# Patient Record
Sex: Female | Born: 1940 | Race: White | Hispanic: No | Marital: Married
Health system: Southern US, Community
[De-identification: ages and names within clinical notes are randomized; demographics above are authoritative.]

## PROBLEM LIST (undated history)

## (undated) DIAGNOSIS — J9621 Acute and chronic respiratory failure with hypoxia: Secondary | ICD-10-CM

## (undated) DIAGNOSIS — J181 Lobar pneumonia, unspecified organism: Secondary | ICD-10-CM

## (undated) DIAGNOSIS — N183 Chronic kidney disease, stage 3 unspecified: Secondary | ICD-10-CM

---

## 2020-08-01 ENCOUNTER — Inpatient Hospital Stay
Admission: RE | Admit: 2020-08-01 | Discharge: 2020-08-27 | Disposition: E | Payer: Medicare Other | Source: Skilled Nursing Facility | Attending: Internal Medicine | Admitting: Internal Medicine

## 2020-08-01 DIAGNOSIS — J9690 Respiratory failure, unspecified, unspecified whether with hypoxia or hypercapnia: Secondary | ICD-10-CM

## 2020-08-01 DIAGNOSIS — J9621 Acute and chronic respiratory failure with hypoxia: Secondary | ICD-10-CM | POA: Diagnosis present

## 2020-08-01 DIAGNOSIS — N183 Chronic kidney disease, stage 3 unspecified: Secondary | ICD-10-CM | POA: Diagnosis present

## 2020-08-01 DIAGNOSIS — J189 Pneumonia, unspecified organism: Secondary | ICD-10-CM

## 2020-08-01 DIAGNOSIS — J181 Lobar pneumonia, unspecified organism: Secondary | ICD-10-CM | POA: Diagnosis present

## 2020-08-01 DIAGNOSIS — J690 Pneumonitis due to inhalation of food and vomit: Secondary | ICD-10-CM

## 2020-08-01 DIAGNOSIS — Z4659 Encounter for fitting and adjustment of other gastrointestinal appliance and device: Secondary | ICD-10-CM

## 2020-08-01 DIAGNOSIS — D72829 Elevated white blood cell count, unspecified: Secondary | ICD-10-CM

## 2020-08-01 DIAGNOSIS — T17908A Unspecified foreign body in respiratory tract, part unspecified causing other injury, initial encounter: Secondary | ICD-10-CM

## 2020-08-01 DIAGNOSIS — R0603 Acute respiratory distress: Secondary | ICD-10-CM

## 2020-08-01 DIAGNOSIS — T85598A Other mechanical complication of other gastrointestinal prosthetic devices, implants and grafts, initial encounter: Secondary | ICD-10-CM

## 2020-08-01 DIAGNOSIS — Z452 Encounter for adjustment and management of vascular access device: Secondary | ICD-10-CM

## 2020-08-01 HISTORY — DX: Lobar pneumonia, unspecified organism: J18.1

## 2020-08-01 HISTORY — DX: Acute and chronic respiratory failure with hypoxia: J96.21

## 2020-08-01 HISTORY — DX: Chronic kidney disease, stage 3 unspecified: N18.30

## 2020-08-02 ENCOUNTER — Other Ambulatory Visit (HOSPITAL_COMMUNITY): Payer: Medicare Other

## 2020-08-02 LAB — CBC WITH DIFFERENTIAL/PLATELET
Abs Immature Granulocytes: 0.15 10*3/uL — ABNORMAL HIGH (ref 0.00–0.07)
Basophils Absolute: 0 10*3/uL (ref 0.0–0.1)
Basophils Relative: 0 %
Eosinophils Absolute: 0 10*3/uL (ref 0.0–0.5)
Eosinophils Relative: 0 %
HCT: 27.2 % — ABNORMAL LOW (ref 36.0–46.0)
Hemoglobin: 8.9 g/dL — ABNORMAL LOW (ref 12.0–15.0)
Immature Granulocytes: 1 %
Lymphocytes Relative: 2 %
Lymphs Abs: 0.3 10*3/uL — ABNORMAL LOW (ref 0.7–4.0)
MCH: 30.6 pg (ref 26.0–34.0)
MCHC: 32.7 g/dL (ref 30.0–36.0)
MCV: 93.5 fL (ref 80.0–100.0)
Monocytes Absolute: 0.4 10*3/uL (ref 0.1–1.0)
Monocytes Relative: 3 %
Neutro Abs: 11.1 10*3/uL — ABNORMAL HIGH (ref 1.7–7.7)
Neutrophils Relative %: 94 %
Platelets: 92 10*3/uL — ABNORMAL LOW (ref 150–400)
RBC: 2.91 MIL/uL — ABNORMAL LOW (ref 3.87–5.11)
RDW: 17.5 % — ABNORMAL HIGH (ref 11.5–15.5)
WBC: 11.9 10*3/uL — ABNORMAL HIGH (ref 4.0–10.5)
nRBC: 0 % (ref 0.0–0.2)

## 2020-08-02 LAB — COMPREHENSIVE METABOLIC PANEL
ALT: 111 U/L — ABNORMAL HIGH (ref 0–44)
AST: 75 U/L — ABNORMAL HIGH (ref 15–41)
Albumin: 1.1 g/dL — ABNORMAL LOW (ref 3.5–5.0)
Alkaline Phosphatase: 173 U/L — ABNORMAL HIGH (ref 38–126)
Anion gap: 3 — ABNORMAL LOW (ref 5–15)
BUN: 88 mg/dL — ABNORMAL HIGH (ref 8–23)
CO2: 24 mmol/L (ref 22–32)
Calcium: 7.1 mg/dL — ABNORMAL LOW (ref 8.9–10.3)
Chloride: 114 mmol/L — ABNORMAL HIGH (ref 98–111)
Creatinine, Ser: 1.55 mg/dL — ABNORMAL HIGH (ref 0.44–1.00)
GFR, Estimated: 34 mL/min — ABNORMAL LOW (ref >60)
Glucose, Bld: 132 mg/dL — ABNORMAL HIGH (ref 70–99)
Potassium: 4.7 mmol/L (ref 3.5–5.1)
Sodium: 141 mmol/L (ref 135–145)
Total Bilirubin: 0.6 mg/dL (ref 0.3–1.2)
Total Protein: 4.2 g/dL — ABNORMAL LOW (ref 6.5–8.1)

## 2020-08-02 LAB — HEMOGLOBIN A1C
Hgb A1c MFr Bld: 6.7 % — ABNORMAL HIGH (ref 4.8–5.6)
Mean Plasma Glucose: 145.59 mg/dL

## 2020-08-02 LAB — MAGNESIUM: Magnesium: 2.6 mg/dL — ABNORMAL HIGH (ref 1.7–2.4)

## 2020-08-02 LAB — TSH: TSH: 1.087 u[IU]/mL (ref 0.350–4.500)

## 2020-08-02 LAB — T4, FREE: Free T4: 1.15 ng/dL — ABNORMAL HIGH (ref 0.61–1.12)

## 2020-08-02 LAB — PHOSPHORUS: Phosphorus: 4.3 mg/dL (ref 2.5–4.6)

## 2020-08-02 LAB — PROTIME-INR
INR: 1.8 — ABNORMAL HIGH (ref 0.8–1.2)
Prothrombin Time: 20.3 seconds — ABNORMAL HIGH (ref 11.4–15.2)

## 2020-08-03 ENCOUNTER — Other Ambulatory Visit (HOSPITAL_COMMUNITY): Payer: Medicare Other

## 2020-08-03 LAB — BLOOD GAS, ARTERIAL
Acid-base deficit: 2.7 mmol/L — ABNORMAL HIGH (ref 0.0–2.0)
Bicarbonate: 21.5 mmol/L (ref 20.0–28.0)
FIO2: 60
O2 Saturation: 99 %
Patient temperature: 36.5
pCO2 arterial: 35.8 mmHg (ref 32.0–48.0)
pH, Arterial: 7.393 (ref 7.350–7.450)
pO2, Arterial: 138 mmHg — ABNORMAL HIGH (ref 83.0–108.0)

## 2020-08-05 LAB — CBC
HCT: 29.4 % — ABNORMAL LOW (ref 36.0–46.0)
Hemoglobin: 9.3 g/dL — ABNORMAL LOW (ref 12.0–15.0)
MCH: 29.8 pg (ref 26.0–34.0)
MCHC: 31.6 g/dL (ref 30.0–36.0)
MCV: 94.2 fL (ref 80.0–100.0)
Platelets: 112 10*3/uL — ABNORMAL LOW (ref 150–400)
RBC: 3.12 MIL/uL — ABNORMAL LOW (ref 3.87–5.11)
RDW: 18.6 % — ABNORMAL HIGH (ref 11.5–15.5)
WBC: 17.1 10*3/uL — ABNORMAL HIGH (ref 4.0–10.5)
nRBC: 0 % (ref 0.0–0.2)

## 2020-08-05 LAB — BASIC METABOLIC PANEL
Anion gap: 4 — ABNORMAL LOW (ref 5–15)
BUN: 76 mg/dL — ABNORMAL HIGH (ref 8–23)
CO2: 25 mmol/L (ref 22–32)
Calcium: 7.2 mg/dL — ABNORMAL LOW (ref 8.9–10.3)
Chloride: 116 mmol/L — ABNORMAL HIGH (ref 98–111)
Creatinine, Ser: 1.48 mg/dL — ABNORMAL HIGH (ref 0.44–1.00)
GFR, Estimated: 36 mL/min — ABNORMAL LOW (ref >60)
Glucose, Bld: 163 mg/dL — ABNORMAL HIGH (ref 70–99)
Potassium: 4.9 mmol/L (ref 3.5–5.1)
Sodium: 145 mmol/L (ref 135–145)

## 2020-08-05 LAB — MAGNESIUM: Magnesium: 2.6 mg/dL — ABNORMAL HIGH (ref 1.7–2.4)

## 2020-08-06 ENCOUNTER — Other Ambulatory Visit (HOSPITAL_COMMUNITY): Payer: Medicare Other

## 2020-08-06 LAB — URINALYSIS, ROUTINE W REFLEX MICROSCOPIC
Bilirubin Urine: NEGATIVE
Glucose, UA: NEGATIVE mg/dL
Hgb urine dipstick: NEGATIVE
Ketones, ur: NEGATIVE mg/dL
Leukocytes,Ua: NEGATIVE
Nitrite: NEGATIVE
Protein, ur: NEGATIVE mg/dL
Specific Gravity, Urine: 1.013 (ref 1.005–1.030)
pH: 5 (ref 5.0–8.0)

## 2020-08-07 ENCOUNTER — Encounter: Payer: Self-pay | Admitting: Internal Medicine

## 2020-08-07 DIAGNOSIS — J9621 Acute and chronic respiratory failure with hypoxia: Secondary | ICD-10-CM | POA: Diagnosis present

## 2020-08-07 DIAGNOSIS — J181 Lobar pneumonia, unspecified organism: Secondary | ICD-10-CM | POA: Diagnosis present

## 2020-08-07 DIAGNOSIS — N183 Chronic kidney disease, stage 3 unspecified: Secondary | ICD-10-CM | POA: Diagnosis present

## 2020-08-07 DIAGNOSIS — N1832 Chronic kidney disease, stage 3b: Secondary | ICD-10-CM

## 2020-08-07 LAB — BASIC METABOLIC PANEL
Anion gap: 8 (ref 5–15)
BUN: 71 mg/dL — ABNORMAL HIGH (ref 8–23)
CO2: 24 mmol/L (ref 22–32)
Calcium: 7.1 mg/dL — ABNORMAL LOW (ref 8.9–10.3)
Chloride: 117 mmol/L — ABNORMAL HIGH (ref 98–111)
Creatinine, Ser: 1.35 mg/dL — ABNORMAL HIGH (ref 0.44–1.00)
GFR, Estimated: 40 mL/min — ABNORMAL LOW (ref >60)
Glucose, Bld: 160 mg/dL — ABNORMAL HIGH (ref 70–99)
Potassium: 5.3 mmol/L — ABNORMAL HIGH (ref 3.5–5.1)
Sodium: 149 mmol/L — ABNORMAL HIGH (ref 135–145)

## 2020-08-07 LAB — CBC
HCT: 26.3 % — ABNORMAL LOW (ref 36.0–46.0)
Hemoglobin: 8.2 g/dL — ABNORMAL LOW (ref 12.0–15.0)
MCH: 30 pg (ref 26.0–34.0)
MCHC: 31.2 g/dL (ref 30.0–36.0)
MCV: 96.3 fL (ref 80.0–100.0)
Platelets: 104 10*3/uL — ABNORMAL LOW (ref 150–400)
RBC: 2.73 MIL/uL — ABNORMAL LOW (ref 3.87–5.11)
RDW: 19.4 % — ABNORMAL HIGH (ref 11.5–15.5)
WBC: 21.7 10*3/uL — ABNORMAL HIGH (ref 4.0–10.5)
nRBC: 0 % (ref 0.0–0.2)

## 2020-08-07 LAB — URINE CULTURE: Culture: <10000 — AB

## 2020-08-07 LAB — MAGNESIUM: Magnesium: 2.5 mg/dL — ABNORMAL HIGH (ref 1.7–2.4)

## 2020-08-07 NOTE — Progress Notes (Signed)
Pulmonary Critical Care Medicine [facility] [place]   PULMONARY CRITICAL CARE SERVICE  PROGRESS NOTE     DAKIYA Clarke  [id number]  DOB: 1941-03-31   DOA: 2020-08-05  Referring Physician: Carron Curie, MD  HPI: Veronica Clarke is a 80 y.o. female seen for follow up of Acute on Chronic Respiratory Failure.  Patient is doing little bit better the heated high flow has been changed over to an Oxymizer saturations are good  Medications: Reviewed on Rounds  Physical Exam:  Vitals: Temperature is 97.2 pulse 82 respiratory 24 blood pressure is 147/67 saturations 96%  Ventilator Settings on Oxymizer  . General: Comfortable at this time . Eyes: Grossly normal lids, irises & conjunctiva . ENT: grossly tongue is normal . Neck: no obvious mass . Cardiovascular: S1 S2 normal no gallop . Respiratory: No rhonchi very coarse percent . Abdomen: soft . Skin: no rash seen on limited exam . Musculoskeletal: not rigid . Psychiatric:unable to assess . Neurologic: no seizure no involuntary movements         Lab Data:   Basic Metabolic Panel: Recent Labs  Lab 08/02/20 0510 08/05/20 0350 08/07/20 0455  NA 141 145 149*  K 4.7 4.9 5.3*  CL 114* 116* 117*  CO2 24 25 24   GLUCOSE 132* 163* 160*  BUN 88* 76* 71*  CREATININE 1.55* 1.48* 1.35*  CALCIUM 7.1* 7.2* 7.1*  MG 2.6* 2.6* 2.5*  PHOS 4.3  --   --     ABG: Recent Labs  Lab 08/03/20 1042  PHART 7.393  PCO2ART 35.8  PO2ART 138*  HCO3 21.5  O2SAT 99.0    Liver Function Tests: Recent Labs  Lab 08/02/20 0510  AST 75*  ALT 111*  ALKPHOS 173*  BILITOT 0.6  PROT 4.2*  ALBUMIN 1.1*   No results for input(s): LIPASE, AMYLASE in the last 168 hours. No results for input(s): AMMONIA in the last 168 hours.  CBC: Recent Labs  Lab 08/02/20 0510 08/05/20 0350 08/07/20 0455  WBC 11.9* 17.1* 21.7*  NEUTROABS 11.1*  --   --   HGB 8.9* 9.3* 8.2*  HCT 27.2* 29.4* 26.3*  MCV 93.5 94.2 96.3  PLT 92* 112* 104*     Cardiac Enzymes: No results for input(s): CKTOTAL, CKMB, CKMBINDEX, TROPONINI in the last 168 hours.  BNP (last 3 results) No results for input(s): BNP in the last 8760 hours.  ProBNP (last 3 results) No results for input(s): PROBNP in the last 8760 hours.  Radiological Exams: DG CHEST PORT 1 VIEW  Result Date: 08/06/2020 CLINICAL DATA:  80 year old female with history of pneumonia and leukocytosis. EXAM: PORTABLE CHEST 1 VIEW COMPARISON:  Chest x-ray 08/02/2020. FINDINGS: A nasogastric tube is seen extending into the stomach, however, the tip of the nasogastric tube extends below the lower margin of the image. There is a right upper extremity PICC with tip terminating in the superior cavoatrial junction. Ill-defined opacities and areas of interstitial prominence are once again scattered throughout the lungs bilaterally, with no significant change in aeration compared to the prior study. No definite pleural effusions. No pneumothorax. Pulmonary vasculature is largely obscured. Heart size is mildly enlarged. Upper mediastinal contours are within normal limits. Atherosclerosis in the thoracic aorta. IMPRESSION: 1. Support apparatus, as above. 2. Persistent multilobar bilateral pneumonia without significant change. 3. Aortic atherosclerosis. Electronically Signed   By: 10/02/2020 M.D.   On: 08/06/2020 05:48    Assessment/Plan Active Problems:   Acute on chronic respiratory failure with hypoxia (HCC)  Chronic kidney disease (CKD), stage III (moderate) (HCC)   Lobar pneumonia, unspecified organism (HCC)   1. Acute on chronic respiratory failure with hypoxia oxygen requirements slightly better plan is going to be to continue to titrate down as tolerated. 2. Stage IIIb chronic kidney disease following the patient's lab work closely. 3. Multi lobar pneumonia treated with antibiotics follow-up x-ray results noted as above   I have personally seen and evaluated the patient, evaluated  laboratory and imaging results, formulated the assessment and plan and placed orders. The Patient requires high complexity decision making with multiple systems involvement.  Rounds were done with the Respiratory Therapy Director and Staff therapists and discussed with nursing staff also.  Yevonne Pax, MD [facility] Pulmonary Critical Care Medicine Sleep Medicine

## 2020-08-08 ENCOUNTER — Other Ambulatory Visit (HOSPITAL_COMMUNITY): Payer: Medicare Other

## 2020-08-08 DIAGNOSIS — J9621 Acute and chronic respiratory failure with hypoxia: Secondary | ICD-10-CM | POA: Diagnosis not present

## 2020-08-08 DIAGNOSIS — J181 Lobar pneumonia, unspecified organism: Secondary | ICD-10-CM | POA: Diagnosis not present

## 2020-08-08 DIAGNOSIS — N1832 Chronic kidney disease, stage 3b: Secondary | ICD-10-CM | POA: Diagnosis not present

## 2020-08-08 LAB — CBC
HCT: 21.7 % — ABNORMAL LOW (ref 36.0–46.0)
Hemoglobin: 6.7 g/dL — CL (ref 12.0–15.0)
MCH: 30.5 pg (ref 26.0–34.0)
MCHC: 30.9 g/dL (ref 30.0–36.0)
MCV: 98.6 fL (ref 80.0–100.0)
Platelets: 78 10*3/uL — ABNORMAL LOW (ref 150–400)
RBC: 2.2 MIL/uL — ABNORMAL LOW (ref 3.87–5.11)
RDW: 19.4 % — ABNORMAL HIGH (ref 11.5–15.5)
WBC: 13.6 10*3/uL — ABNORMAL HIGH (ref 4.0–10.5)
nRBC: 0 % (ref 0.0–0.2)

## 2020-08-08 LAB — RENAL FUNCTION PANEL
Albumin: 1.2 g/dL — ABNORMAL LOW (ref 3.5–5.0)
Anion gap: 4 — ABNORMAL LOW (ref 5–15)
BUN: 69 mg/dL — ABNORMAL HIGH (ref 8–23)
CO2: 24 mmol/L (ref 22–32)
Calcium: 6.9 mg/dL — ABNORMAL LOW (ref 8.9–10.3)
Chloride: 118 mmol/L — ABNORMAL HIGH (ref 98–111)
Creatinine, Ser: 1.27 mg/dL — ABNORMAL HIGH (ref 0.44–1.00)
GFR, Estimated: 43 mL/min — ABNORMAL LOW (ref >60)
Glucose, Bld: 186 mg/dL — ABNORMAL HIGH (ref 70–99)
Phosphorus: 3.3 mg/dL (ref 2.5–4.6)
Potassium: 4.4 mmol/L (ref 3.5–5.1)
Sodium: 146 mmol/L — ABNORMAL HIGH (ref 135–145)

## 2020-08-08 LAB — C DIFFICILE (CDIFF) QUICK SCRN (NO PCR REFLEX)
C Diff antigen: NEGATIVE
C Diff interpretation: NOT DETECTED
C Diff toxin: NEGATIVE

## 2020-08-08 LAB — PREPARE RBC (CROSSMATCH)

## 2020-08-08 LAB — LACTIC ACID, PLASMA: Lactic Acid, Venous: 0.9 mmol/L (ref 0.5–1.9)

## 2020-08-08 LAB — MAGNESIUM: Magnesium: 2.4 mg/dL (ref 1.7–2.4)

## 2020-08-08 LAB — OCCULT BLOOD X 1 CARD TO LAB, STOOL: Fecal Occult Bld: POSITIVE — AB

## 2020-08-08 LAB — ABO/RH: ABO/RH(D): O POS

## 2020-08-08 NOTE — Progress Notes (Signed)
Pulmonary Critical Care Medicine [facility] [place]   PULMONARY CRITICAL CARE SERVICE  PROGRESS NOTE     Veronica Clarke  YFV:[id number]  DOB: 07/11/1940   DOA: 2020-08-04  Referring Physician: Carron Curie, MD  HPI: Veronica Clarke is a 80 y.o. female seen for follow up of Acute on Chronic Respiratory Failure.  Patient is off the ventilator on Oxymizer right now requiring 8 L  Medications: Reviewed on Rounds  Physical Exam:  Vitals: Temperature is 98.1 pulse 70 respiratory rate 16 blood pressure is 114/60 saturations 95%  Ventilator Settings on 8 L Oxymizer  . General: Comfortable at this time . Eyes: Grossly normal lids, irises & conjunctiva . ENT: grossly tongue is normal . Neck: no obvious mass . Cardiovascular: S1 S2 normal no gallop . Respiratory: Scattered rhonchi expansion is equal . Abdomen: soft . Skin: no rash seen on limited exam . Musculoskeletal: not rigid . Psychiatric:unable to assess . Neurologic: no seizure no involuntary movements         Lab Data:   Basic Metabolic Panel: Recent Labs  Lab 08/02/20 0510 08/05/20 0350 08/07/20 0455 08/08/20 0517  NA 141 145 149* 146*  K 4.7 4.9 5.3* 4.4  CL 114* 116* 117* 118*  CO2 24 25 24 24   GLUCOSE 132* 163* 160* 186*  BUN 88* 76* 71* 69*  CREATININE 1.55* 1.48* 1.35* 1.27*  CALCIUM 7.1* 7.2* 7.1* 6.9*  MG 2.6* 2.6* 2.5* 2.4  PHOS 4.3  --   --  3.3    ABG: Recent Labs  Lab 08/03/20 1042  PHART 7.393  PCO2ART 35.8  PO2ART 138*  HCO3 21.5  O2SAT 99.0    Liver Function Tests: Recent Labs  Lab 08/02/20 0510 08/08/20 0517  AST 75*  --   ALT 111*  --   ALKPHOS 173*  --   BILITOT 0.6  --   PROT 4.2*  --   ALBUMIN 1.1* 1.2*   No results for input(s): LIPASE, AMYLASE in the last 168 hours. No results for input(s): AMMONIA in the last 168 hours.  CBC: Recent Labs  Lab 08/02/20 0510 08/05/20 0350 08/07/20 0455 08/08/20 0517  WBC 11.9* 17.1* 21.7* 13.6*  NEUTROABS 11.1*  --    --   --   HGB 8.9* 9.3* 8.2* 6.7*  HCT 27.2* 29.4* 26.3* 21.7*  MCV 93.5 94.2 96.3 98.6  PLT 92* 112* 104* 78*    Cardiac Enzymes: No results for input(s): CKTOTAL, CKMB, CKMBINDEX, TROPONINI in the last 168 hours.  BNP (last 3 results) No results for input(s): BNP in the last 8760 hours.  ProBNP (last 3 results) No results for input(s): PROBNP in the last 8760 hours.  Radiological Exams: DG CHEST PORT 1 VIEW  Result Date: 08/08/2020 CLINICAL DATA:  80 year old female with acute on chronic respiratory failure. Bilateral pneumonia suspected. EXAM: PORTABLE CHEST 1 VIEW COMPARISON:  Portable chest 08/06/2020 and earlier. FINDINGS: Portable AP semi upright view at 0618 hours. Enteric tube has been placed into the stomach, side hole the level of the gastric body. Stable right PICC line. Stable lung volumes and mediastinal contours since 08/02/2020. No cardiomegaly. Coarse asymmetric pulmonary interstitial and streaky opacity is stable since that time, with patchy opacity at the left lung base and mid right lung. No pneumothorax or pleural effusion. Continued paucity of bowel gas. Stable cholecystectomy clips. No acute osseous abnormality identified. IMPRESSION: 1. Enteric tube placed into the stomach, side hole the level of the gastric body. 2. Stable ventilation since  08/02/2020 with widespread coarse and streaky asymmetric pulmonary opacity compatible with widespread viral/atypical respiratory infection. Electronically Signed   By: Odessa Fleming M.D.   On: 08/08/2020 07:15    Assessment/Plan Active Problems:   Acute on chronic respiratory failure with hypoxia (HCC)   Chronic kidney disease (CKD), stage III (moderate) (HCC)   Lobar pneumonia, unspecified organism (HCC)   1. Acute on chronic respiratory failure hypoxia plan is going to be to continue to try to wean the FiO2 down as tolerated. 2. Chronic kidney disease stage III supportive care 3. Lobar pneumonia treated slowly improving   I  have personally seen and evaluated the patient, evaluated laboratory and imaging results, formulated the assessment and plan and placed orders. The Patient requires high complexity decision making with multiple systems involvement.  Rounds were done with the Respiratory Therapy Director and Staff therapists and discussed with nursing staff also.  Yevonne Pax, MD [facility] Pulmonary Critical Care Medicine Sleep Medicine

## 2020-08-09 LAB — MAGNESIUM: Magnesium: 2.4 mg/dL (ref 1.7–2.4)

## 2020-08-09 LAB — BPAM RBC
Blood Product Expiration Date: 202205122359
ISSUE DATE / TIME: 202204121044
Unit Type and Rh: 5100

## 2020-08-09 LAB — RENAL FUNCTION PANEL
Albumin: 1.2 g/dL — ABNORMAL LOW (ref 3.5–5.0)
Anion gap: 5 (ref 5–15)
BUN: 56 mg/dL — ABNORMAL HIGH (ref 8–23)
CO2: 22 mmol/L (ref 22–32)
Calcium: 7.2 mg/dL — ABNORMAL LOW (ref 8.9–10.3)
Chloride: 117 mmol/L — ABNORMAL HIGH (ref 98–111)
Creatinine, Ser: 1.1 mg/dL — ABNORMAL HIGH (ref 0.44–1.00)
GFR, Estimated: 51 mL/min — ABNORMAL LOW (ref >60)
Glucose, Bld: 215 mg/dL — ABNORMAL HIGH (ref 70–99)
Phosphorus: 3 mg/dL (ref 2.5–4.6)
Potassium: 3.9 mmol/L (ref 3.5–5.1)
Sodium: 144 mmol/L (ref 135–145)

## 2020-08-09 LAB — TYPE AND SCREEN
ABO/RH(D): O POS
Antibody Screen: NEGATIVE
Unit division: 0

## 2020-08-09 LAB — CBC
HCT: 26.7 % — ABNORMAL LOW (ref 36.0–46.0)
Hemoglobin: 8.1 g/dL — ABNORMAL LOW (ref 12.0–15.0)
MCH: 28.5 pg (ref 26.0–34.0)
MCHC: 30.3 g/dL (ref 30.0–36.0)
MCV: 94 fL (ref 80.0–100.0)
Platelets: 63 10*3/uL — ABNORMAL LOW (ref 150–400)
RBC: 2.84 MIL/uL — ABNORMAL LOW (ref 3.87–5.11)
RDW: 21.9 % — ABNORMAL HIGH (ref 11.5–15.5)
WBC: 9.6 10*3/uL (ref 4.0–10.5)
nRBC: 0 % (ref 0.0–0.2)

## 2020-08-10 ENCOUNTER — Other Ambulatory Visit (HOSPITAL_COMMUNITY): Payer: Medicare Other

## 2020-08-10 LAB — CBC
HCT: 26.5 % — ABNORMAL LOW (ref 36.0–46.0)
Hemoglobin: 8.2 g/dL — ABNORMAL LOW (ref 12.0–15.0)
MCH: 29.1 pg (ref 26.0–34.0)
MCHC: 30.9 g/dL (ref 30.0–36.0)
MCV: 94 fL (ref 80.0–100.0)
Platelets: 61 10*3/uL — ABNORMAL LOW (ref 150–400)
RBC: 2.82 MIL/uL — ABNORMAL LOW (ref 3.87–5.11)
RDW: 21.3 % — ABNORMAL HIGH (ref 11.5–15.5)
WBC: 8.2 10*3/uL (ref 4.0–10.5)
nRBC: 0 % (ref 0.0–0.2)

## 2020-08-10 LAB — RENAL FUNCTION PANEL
Albumin: 1.1 g/dL — ABNORMAL LOW (ref 3.5–5.0)
Anion gap: 4 — ABNORMAL LOW (ref 5–15)
BUN: 52 mg/dL — ABNORMAL HIGH (ref 8–23)
CO2: 23 mmol/L (ref 22–32)
Calcium: 7.7 mg/dL — ABNORMAL LOW (ref 8.9–10.3)
Chloride: 118 mmol/L — ABNORMAL HIGH (ref 98–111)
Creatinine, Ser: 1.09 mg/dL — ABNORMAL HIGH (ref 0.44–1.00)
GFR, Estimated: 51 mL/min — ABNORMAL LOW (ref >60)
Glucose, Bld: 78 mg/dL (ref 70–99)
Phosphorus: 3.5 mg/dL (ref 2.5–4.6)
Potassium: 3.9 mmol/L (ref 3.5–5.1)
Sodium: 145 mmol/L (ref 135–145)

## 2020-08-10 LAB — MAGNESIUM: Magnesium: 2.4 mg/dL (ref 1.7–2.4)

## 2020-08-11 LAB — CBC
HCT: 27.1 % — ABNORMAL LOW (ref 36.0–46.0)
Hemoglobin: 8.5 g/dL — ABNORMAL LOW (ref 12.0–15.0)
MCH: 29.3 pg (ref 26.0–34.0)
MCHC: 31.4 g/dL (ref 30.0–36.0)
MCV: 93.4 fL (ref 80.0–100.0)
Platelets: 61 10*3/uL — ABNORMAL LOW (ref 150–400)
RBC: 2.9 MIL/uL — ABNORMAL LOW (ref 3.87–5.11)
RDW: 21.5 % — ABNORMAL HIGH (ref 11.5–15.5)
WBC: 12 10*3/uL — ABNORMAL HIGH (ref 4.0–10.5)
nRBC: 0 % (ref 0.0–0.2)

## 2020-08-11 LAB — RENAL FUNCTION PANEL
Albumin: 1.2 g/dL — ABNORMAL LOW (ref 3.5–5.0)
Anion gap: 1 — ABNORMAL LOW (ref 5–15)
BUN: 56 mg/dL — ABNORMAL HIGH (ref 8–23)
CO2: 23 mmol/L (ref 22–32)
Calcium: 7.6 mg/dL — ABNORMAL LOW (ref 8.9–10.3)
Chloride: 117 mmol/L — ABNORMAL HIGH (ref 98–111)
Creatinine, Ser: 1.17 mg/dL — ABNORMAL HIGH (ref 0.44–1.00)
GFR, Estimated: 47 mL/min — ABNORMAL LOW (ref >60)
Glucose, Bld: 154 mg/dL — ABNORMAL HIGH (ref 70–99)
Phosphorus: 3.2 mg/dL (ref 2.5–4.6)
Potassium: 3.7 mmol/L (ref 3.5–5.1)
Sodium: 141 mmol/L (ref 135–145)

## 2020-08-11 LAB — MAGNESIUM: Magnesium: 2.4 mg/dL (ref 1.7–2.4)

## 2020-08-12 LAB — CULTURE, BLOOD (ROUTINE X 2)
Culture: NO GROWTH
Culture: NO GROWTH

## 2020-08-13 ENCOUNTER — Other Ambulatory Visit (HOSPITAL_COMMUNITY): Payer: Medicare Other

## 2020-08-13 LAB — RENAL FUNCTION PANEL
Albumin: 1.2 g/dL — ABNORMAL LOW (ref 3.5–5.0)
Anion gap: 3 — ABNORMAL LOW (ref 5–15)
BUN: 51 mg/dL — ABNORMAL HIGH (ref 8–23)
CO2: 24 mmol/L (ref 22–32)
Calcium: 8.1 mg/dL — ABNORMAL LOW (ref 8.9–10.3)
Chloride: 119 mmol/L — ABNORMAL HIGH (ref 98–111)
Creatinine, Ser: 0.99 mg/dL (ref 0.44–1.00)
GFR, Estimated: 58 mL/min — ABNORMAL LOW (ref >60)
Glucose, Bld: 118 mg/dL — ABNORMAL HIGH (ref 70–99)
Phosphorus: 2.6 mg/dL (ref 2.5–4.6)
Potassium: 3.7 mmol/L (ref 3.5–5.1)
Sodium: 146 mmol/L — ABNORMAL HIGH (ref 135–145)

## 2020-08-13 LAB — CBC
HCT: 28.5 % — ABNORMAL LOW (ref 36.0–46.0)
Hemoglobin: 8.9 g/dL — ABNORMAL LOW (ref 12.0–15.0)
MCH: 29.1 pg (ref 26.0–34.0)
MCHC: 31.2 g/dL (ref 30.0–36.0)
MCV: 93.1 fL (ref 80.0–100.0)
Platelets: 60 10*3/uL — ABNORMAL LOW (ref 150–400)
RBC: 3.06 MIL/uL — ABNORMAL LOW (ref 3.87–5.11)
RDW: 22.3 % — ABNORMAL HIGH (ref 11.5–15.5)
WBC: 10 10*3/uL (ref 4.0–10.5)
nRBC: 0 % (ref 0.0–0.2)

## 2020-08-13 LAB — MAGNESIUM: Magnesium: 2.3 mg/dL (ref 1.7–2.4)

## 2020-08-14 LAB — CBC
HCT: 28 % — ABNORMAL LOW (ref 36.0–46.0)
Hemoglobin: 8.5 g/dL — ABNORMAL LOW (ref 12.0–15.0)
MCH: 28.8 pg (ref 26.0–34.0)
MCHC: 30.4 g/dL (ref 30.0–36.0)
MCV: 94.9 fL (ref 80.0–100.0)
Platelets: 54 10*3/uL — ABNORMAL LOW (ref 150–400)
RBC: 2.95 MIL/uL — ABNORMAL LOW (ref 3.87–5.11)
RDW: 22.5 % — ABNORMAL HIGH (ref 11.5–15.5)
WBC: 7.2 10*3/uL (ref 4.0–10.5)
nRBC: 0 % (ref 0.0–0.2)

## 2020-08-14 LAB — RENAL FUNCTION PANEL
Albumin: 1.2 g/dL — ABNORMAL LOW (ref 3.5–5.0)
Anion gap: 4 — ABNORMAL LOW (ref 5–15)
BUN: 55 mg/dL — ABNORMAL HIGH (ref 8–23)
CO2: 22 mmol/L (ref 22–32)
Calcium: 7.9 mg/dL — ABNORMAL LOW (ref 8.9–10.3)
Chloride: 116 mmol/L — ABNORMAL HIGH (ref 98–111)
Creatinine, Ser: 0.99 mg/dL (ref 0.44–1.00)
GFR, Estimated: 58 mL/min — ABNORMAL LOW (ref >60)
Glucose, Bld: 224 mg/dL — ABNORMAL HIGH (ref 70–99)
Phosphorus: 2.8 mg/dL (ref 2.5–4.6)
Potassium: 3.7 mmol/L (ref 3.5–5.1)
Sodium: 142 mmol/L (ref 135–145)

## 2020-08-14 LAB — MAGNESIUM: Magnesium: 2.4 mg/dL (ref 1.7–2.4)

## 2020-08-16 ENCOUNTER — Other Ambulatory Visit (HOSPITAL_COMMUNITY): Payer: Medicare Other

## 2020-08-16 LAB — CBC
HCT: 27.4 % — ABNORMAL LOW (ref 36.0–46.0)
Hemoglobin: 8.1 g/dL — ABNORMAL LOW (ref 12.0–15.0)
MCH: 28.7 pg (ref 26.0–34.0)
MCHC: 29.6 g/dL — ABNORMAL LOW (ref 30.0–36.0)
MCV: 97.2 fL (ref 80.0–100.0)
Platelets: 48 10*3/uL — ABNORMAL LOW (ref 150–400)
RBC: 2.82 MIL/uL — ABNORMAL LOW (ref 3.87–5.11)
RDW: 23.5 % — ABNORMAL HIGH (ref 11.5–15.5)
WBC: 5.5 10*3/uL (ref 4.0–10.5)
nRBC: 0.4 % — ABNORMAL HIGH (ref 0.0–0.2)

## 2020-08-16 LAB — BLOOD GAS, ARTERIAL
Acid-base deficit: 6.1 mmol/L — ABNORMAL HIGH (ref 0.0–2.0)
Acid-base deficit: 6.3 mmol/L — ABNORMAL HIGH (ref 0.0–2.0)
Acid-base deficit: 6.2 mmol/L — ABNORMAL HIGH (ref 0.0–2.0)
Bicarbonate: 20.2 mmol/L (ref 20.0–28.0)
Bicarbonate: 20.3 mmol/L (ref 20.0–28.0)
Bicarbonate: 20.8 mmol/L (ref 20.0–28.0)
FIO2: 40
FIO2: 35
FIO2: 35
O2 Saturation: 97.1 %
O2 Saturation: 94.7 %
O2 Saturation: 96.4 %
Patient temperature: 35.3
Patient temperature: 35.3
Patient temperature: 35.6
pCO2 arterial: 45.6 mmHg (ref 32.0–48.0)
pCO2 arterial: 48.6 mmHg — ABNORMAL HIGH (ref 32.0–48.0)
pCO2 arterial: 53.7 mmHg — ABNORMAL HIGH (ref 32.0–48.0)
pH, Arterial: 7.257 — ABNORMAL LOW (ref 7.350–7.450)
pH, Arterial: 7.234 — ABNORMAL LOW (ref 7.350–7.450)
pH, Arterial: 7.203 — ABNORMAL LOW (ref 7.350–7.450)
pO2, Arterial: 81.8 mmHg — ABNORMAL LOW (ref 83.0–108.0)
pO2, Arterial: 65.7 mmHg — ABNORMAL LOW (ref 83.0–108.0)
pO2, Arterial: 79.7 mmHg — ABNORMAL LOW (ref 83.0–108.0)

## 2020-08-16 LAB — BASIC METABOLIC PANEL
Anion gap: 4 — ABNORMAL LOW (ref 5–15)
BUN: 78 mg/dL — ABNORMAL HIGH (ref 8–23)
CO2: 22 mmol/L (ref 22–32)
Calcium: 8.1 mg/dL — ABNORMAL LOW (ref 8.9–10.3)
Chloride: 112 mmol/L — ABNORMAL HIGH (ref 98–111)
Creatinine, Ser: 1.38 mg/dL — ABNORMAL HIGH (ref 0.44–1.00)
GFR, Estimated: 39 mL/min — ABNORMAL LOW (ref >60)
Glucose, Bld: 149 mg/dL — ABNORMAL HIGH (ref 70–99)
Potassium: 3.7 mmol/L (ref 3.5–5.1)
Sodium: 138 mmol/L (ref 135–145)

## 2020-08-16 LAB — MAGNESIUM: Magnesium: 2.4 mg/dL (ref 1.7–2.4)

## 2020-08-16 LAB — AMMONIA: Ammonia: 30 umol/L (ref 9–35)

## 2020-08-17 ENCOUNTER — Other Ambulatory Visit (HOSPITAL_COMMUNITY): Payer: Medicare Other

## 2020-08-17 LAB — BASIC METABOLIC PANEL
Anion gap: 4 — ABNORMAL LOW (ref 5–15)
BUN: 84 mg/dL — ABNORMAL HIGH (ref 8–23)
CO2: 21 mmol/L — ABNORMAL LOW (ref 22–32)
Calcium: 7.9 mg/dL — ABNORMAL LOW (ref 8.9–10.3)
Chloride: 115 mmol/L — ABNORMAL HIGH (ref 98–111)
Creatinine, Ser: 1.4 mg/dL — ABNORMAL HIGH (ref 0.44–1.00)
GFR, Estimated: 38 mL/min — ABNORMAL LOW (ref >60)
Glucose, Bld: 43 mg/dL — CL (ref 70–99)
Potassium: 3.7 mmol/L (ref 3.5–5.1)
Sodium: 140 mmol/L (ref 135–145)

## 2020-08-17 LAB — CBC
HCT: 22.8 % — ABNORMAL LOW (ref 36.0–46.0)
Hemoglobin: 7 g/dL — ABNORMAL LOW (ref 12.0–15.0)
MCH: 29 pg (ref 26.0–34.0)
MCHC: 30.7 g/dL (ref 30.0–36.0)
MCV: 94.6 fL (ref 80.0–100.0)
Platelets: 43 10*3/uL — ABNORMAL LOW (ref 150–400)
RBC: 2.41 MIL/uL — ABNORMAL LOW (ref 3.87–5.11)
RDW: 23.6 % — ABNORMAL HIGH (ref 11.5–15.5)
WBC: 4.7 10*3/uL (ref 4.0–10.5)
nRBC: 0.4 % — ABNORMAL HIGH (ref 0.0–0.2)

## 2020-08-17 LAB — BLOOD GAS, ARTERIAL
Acid-base deficit: 4.5 mmol/L — ABNORMAL HIGH (ref 0.0–2.0)
Bicarbonate: 20.9 mmol/L (ref 20.0–28.0)
FIO2: 28
O2 Saturation: 95.9 %
Patient temperature: 35.7
pCO2 arterial: 41.7 mmHg (ref 32.0–48.0)
pH, Arterial: 7.313 — ABNORMAL LOW (ref 7.350–7.450)
pO2, Arterial: 69.2 mmHg — ABNORMAL LOW (ref 83.0–108.0)

## 2020-08-17 LAB — EXPECTORATED SPUTUM ASSESSMENT W GRAM STAIN, RFLX TO RESP C

## 2020-08-17 LAB — PREPARE RBC (CROSSMATCH)

## 2020-08-17 LAB — MAGNESIUM: Magnesium: 2.5 mg/dL — ABNORMAL HIGH (ref 1.7–2.4)

## 2020-08-18 LAB — BPAM RBC
Blood Product Expiration Date: 202205202359
ISSUE DATE / TIME: 202204211533
Unit Type and Rh: 5100

## 2020-08-18 LAB — RENAL FUNCTION PANEL
Albumin: 2.1 g/dL — ABNORMAL LOW (ref 3.5–5.0)
Anion gap: 6 (ref 5–15)
BUN: 75 mg/dL — ABNORMAL HIGH (ref 8–23)
CO2: 21 mmol/L — ABNORMAL LOW (ref 22–32)
Calcium: 7.8 mg/dL — ABNORMAL LOW (ref 8.9–10.3)
Chloride: 116 mmol/L — ABNORMAL HIGH (ref 98–111)
Creatinine, Ser: 1.46 mg/dL — ABNORMAL HIGH (ref 0.44–1.00)
GFR, Estimated: 36 mL/min — ABNORMAL LOW (ref >60)
Glucose, Bld: 74 mg/dL (ref 70–99)
Phosphorus: 4.2 mg/dL (ref 2.5–4.6)
Potassium: 3.7 mmol/L (ref 3.5–5.1)
Sodium: 143 mmol/L (ref 135–145)

## 2020-08-18 LAB — CBC
HCT: 29.3 % — ABNORMAL LOW (ref 36.0–46.0)
Hemoglobin: 9.2 g/dL — ABNORMAL LOW (ref 12.0–15.0)
MCH: 29.3 pg (ref 26.0–34.0)
MCHC: 31.4 g/dL (ref 30.0–36.0)
MCV: 93.3 fL (ref 80.0–100.0)
Platelets: 46 10*3/uL — ABNORMAL LOW (ref 150–400)
RBC: 3.14 MIL/uL — ABNORMAL LOW (ref 3.87–5.11)
RDW: 22.6 % — ABNORMAL HIGH (ref 11.5–15.5)
WBC: 3.8 10*3/uL — ABNORMAL LOW (ref 4.0–10.5)
nRBC: 0.8 % — ABNORMAL HIGH (ref 0.0–0.2)

## 2020-08-18 LAB — TYPE AND SCREEN
ABO/RH(D): O POS
Antibody Screen: NEGATIVE
Unit division: 0

## 2020-08-18 LAB — MAGNESIUM: Magnesium: 2.5 mg/dL — ABNORMAL HIGH (ref 1.7–2.4)

## 2020-08-19 ENCOUNTER — Other Ambulatory Visit (HOSPITAL_COMMUNITY): Payer: Medicare Other

## 2020-08-19 LAB — BLOOD GAS, ARTERIAL
Acid-base deficit: 7.6 mmol/L — ABNORMAL HIGH (ref 0.0–2.0)
Bicarbonate: 20.2 mmol/L (ref 20.0–28.0)
Drawn by: 164
FIO2: 36
O2 Saturation: 98.7 %
Patient temperature: 37
pCO2 arterial: 63.9 mmHg — ABNORMAL HIGH (ref 32.0–48.0)
pH, Arterial: 7.128 — CL (ref 7.350–7.450)
pO2, Arterial: 151 mmHg — ABNORMAL HIGH (ref 83.0–108.0)

## 2020-08-19 LAB — EXPECTORATED SPUTUM ASSESSMENT W GRAM STAIN, RFLX TO RESP C

## 2020-08-19 LAB — VANCOMYCIN, TROUGH: Vancomycin Tr: 23 ug/mL (ref 15–20)

## 2020-08-20 LAB — CBC
HCT: 27.9 % — ABNORMAL LOW (ref 36.0–46.0)
Hemoglobin: 8.6 g/dL — ABNORMAL LOW (ref 12.0–15.0)
MCH: 29.5 pg (ref 26.0–34.0)
MCHC: 30.8 g/dL (ref 30.0–36.0)
MCV: 95.5 fL (ref 80.0–100.0)
Platelets: 65 10*3/uL — ABNORMAL LOW (ref 150–400)
RBC: 2.92 MIL/uL — ABNORMAL LOW (ref 3.87–5.11)
RDW: 23.3 % — ABNORMAL HIGH (ref 11.5–15.5)
WBC: 3.6 10*3/uL — ABNORMAL LOW (ref 4.0–10.5)
nRBC: 0.6 % — ABNORMAL HIGH (ref 0.0–0.2)

## 2020-08-20 LAB — RENAL FUNCTION PANEL
Albumin: 1.8 g/dL — ABNORMAL LOW (ref 3.5–5.0)
Anion gap: 6 (ref 5–15)
BUN: 68 mg/dL — ABNORMAL HIGH (ref 8–23)
CO2: 19 mmol/L — ABNORMAL LOW (ref 22–32)
Calcium: 7.3 mg/dL — ABNORMAL LOW (ref 8.9–10.3)
Chloride: 113 mmol/L — ABNORMAL HIGH (ref 98–111)
Creatinine, Ser: 1.56 mg/dL — ABNORMAL HIGH (ref 0.44–1.00)
GFR, Estimated: 33 mL/min — ABNORMAL LOW (ref >60)
Glucose, Bld: 101 mg/dL — ABNORMAL HIGH (ref 70–99)
Phosphorus: 4.3 mg/dL (ref 2.5–4.6)
Potassium: 3.4 mmol/L — ABNORMAL LOW (ref 3.5–5.1)
Sodium: 138 mmol/L (ref 135–145)

## 2020-08-20 LAB — MAGNESIUM: Magnesium: 2.5 mg/dL — ABNORMAL HIGH (ref 1.7–2.4)

## 2020-08-27 DEATH — deceased

## 2022-12-03 IMAGING — DX DG CHEST 1V PORT
1 series · 1 of 1 positions shown · non-contrast
Comparison: Chest x-ray 08/02/2020.

CLINICAL DATA: 79-year-old female with history of pneumonia and
leukocytosis.

EXAM:
PORTABLE CHEST 1 VIEW

[chest ap]
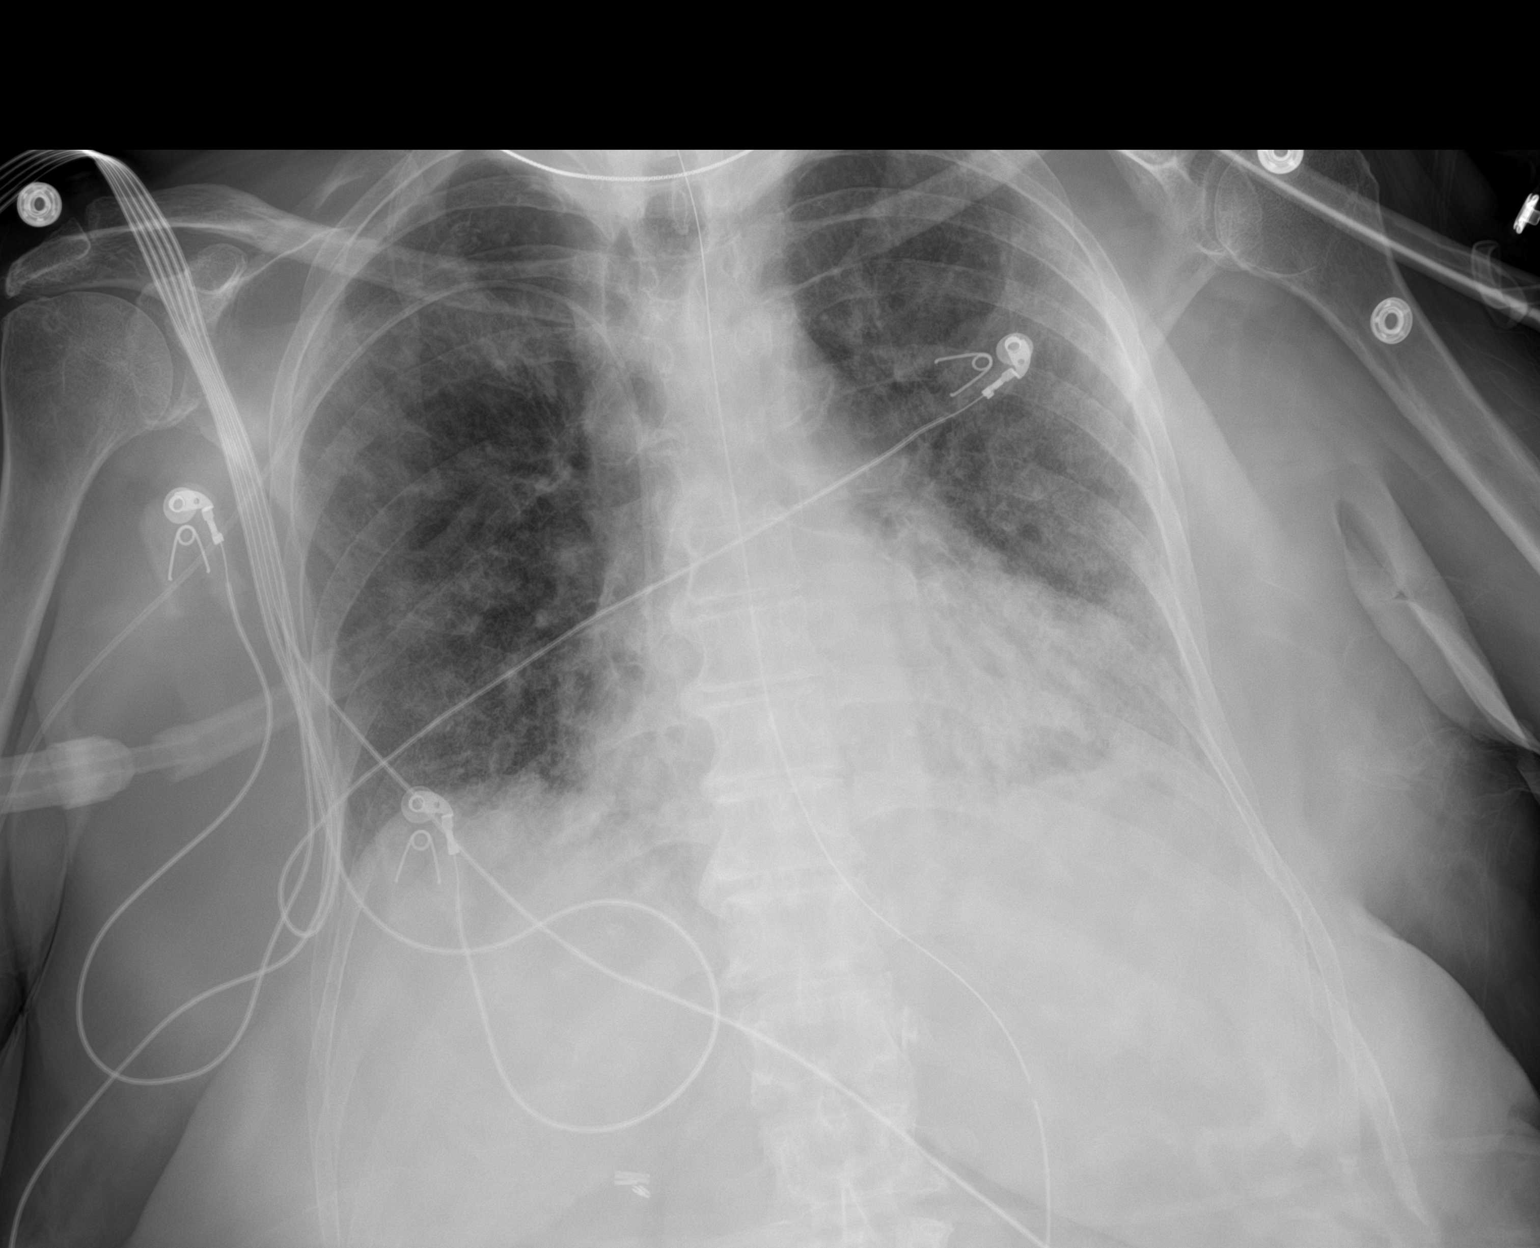

[1 of 1 positions shown; findings below may reference images not displayed]

FINDINGS: A nasogastric tube is seen extending into the stomach, however, the
tip of the nasogastric tube extends below the lower margin of the
image. There is a right upper extremity PICC with tip terminating in
the superior cavoatrial junction. Ill-defined opacities and areas of
interstitial prominence are once again scattered throughout the
lungs bilaterally, with no significant change in aeration compared
to the prior study. No definite pleural effusions. No pneumothorax.
Pulmonary vasculature is largely obscured. Heart size is mildly
enlarged. Upper mediastinal contours are within normal limits.
Atherosclerosis in the thoracic aorta.
IMPRESSION: 1. Support apparatus, as above.
2. Persistent multilobar bilateral pneumonia without significant
change.
3. Aortic atherosclerosis.

## 2022-12-16 IMAGING — DX DG CHEST 1V PORT
1 series · 1 of 1 positions shown · non-contrast
Comparison: August 17, 2020

CLINICAL DATA: History of pneumonia. Status post PICC line
placement.

EXAM:
PORTABLE CHEST 1 VIEW

[chest ap]
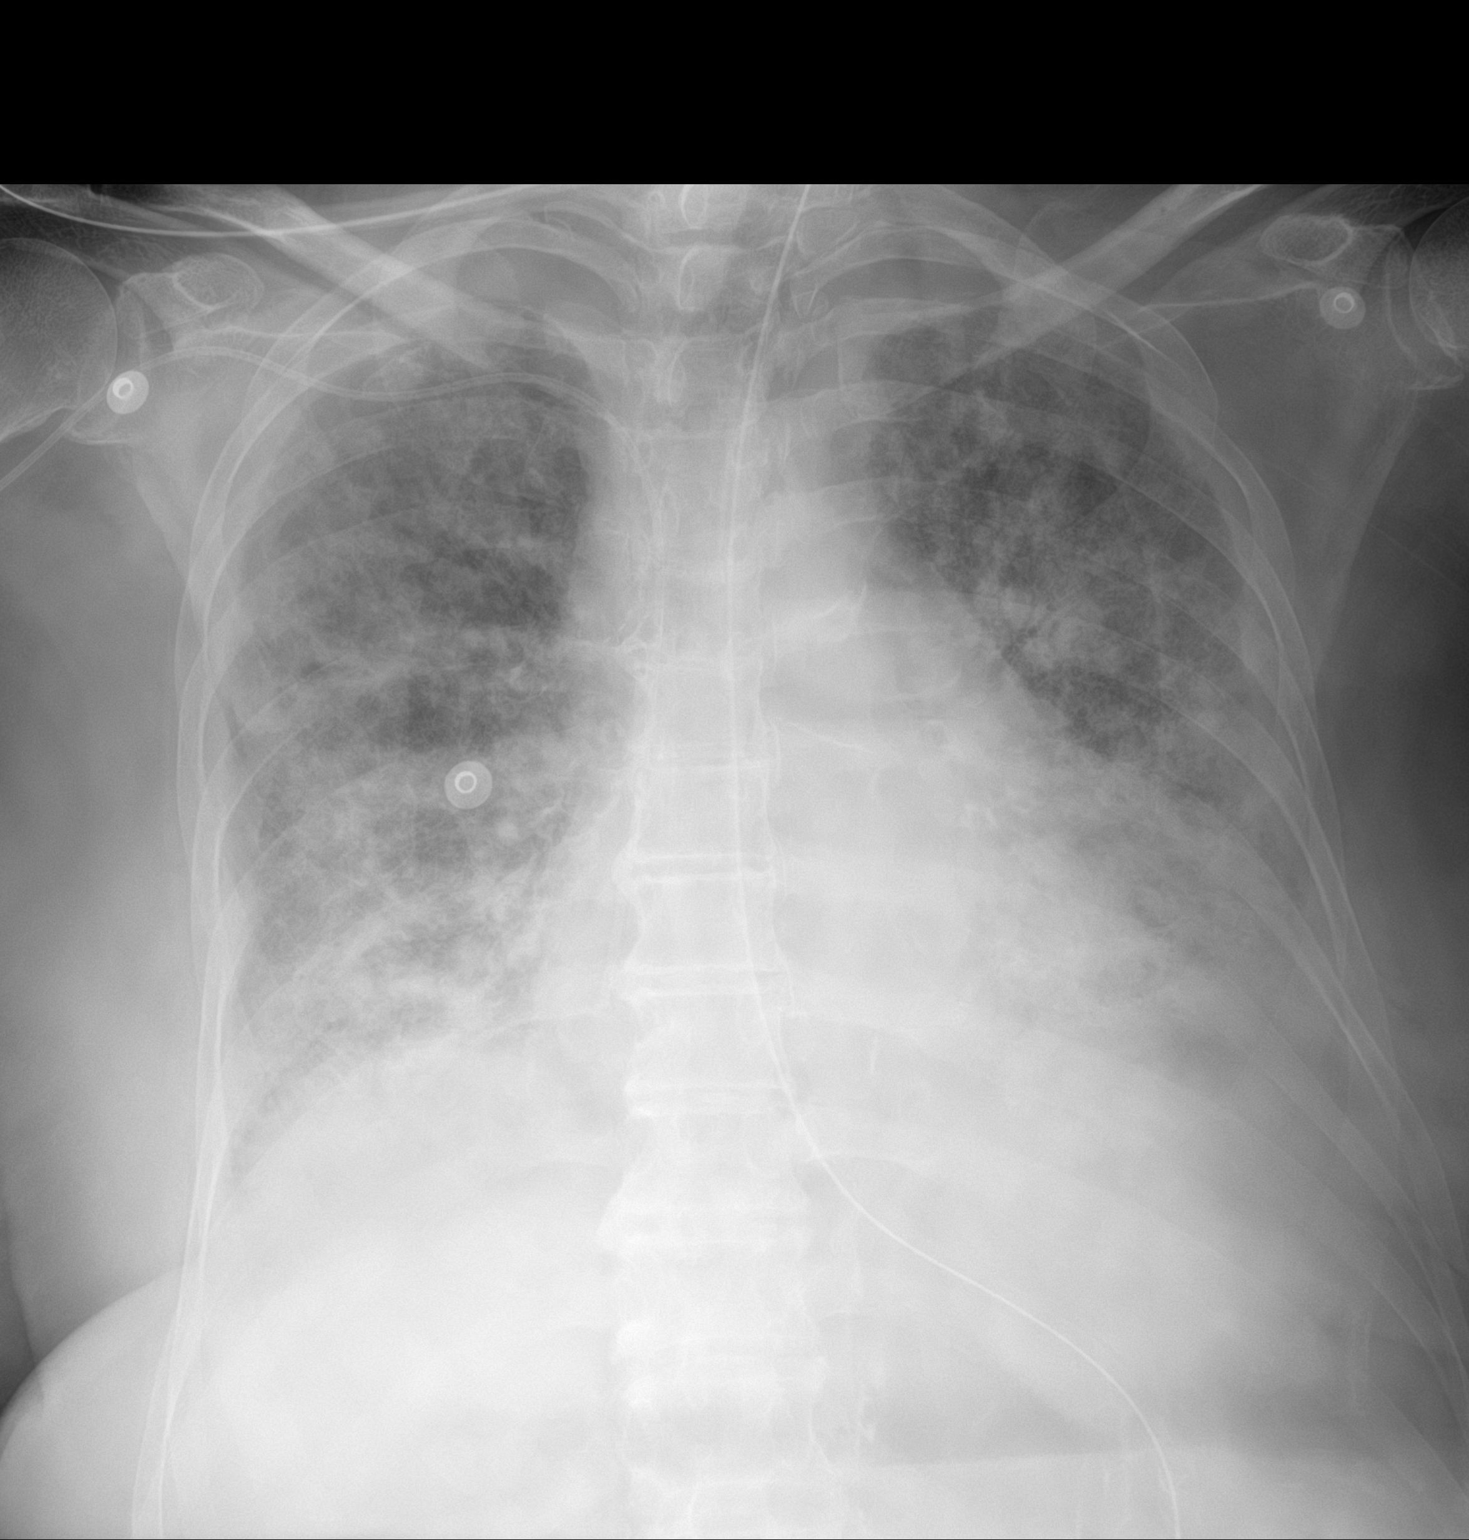

[1 of 1 positions shown; findings below may reference images not displayed]

FINDINGS: The right PICC line terminates in the right atrium below the caval
atrial junction by [place]amps 2.7 cm. Diffuse patchy pulmonary opacities
are identified consistent with history of pneumonia. The NG tube
terminates below today's film. No other acute abnormalities.
IMPRESSION: 1. The distal tip of the right PICC line is in the right atrium.
Recommend withdrawing 2.7 cm.
2. Continued diffuse pulmonary infiltrates consistent with history
of pneumonia.

These results will be called to the ordering clinician or
representative by the Radiologist Assistant, and communication
documented in the PACS or [REDACTED].
